# Patient Record
Sex: Female | Born: 1990 | Race: White | Hispanic: No | Marital: Single | Smoking: Current every day smoker
Health system: Southern US, Community
[De-identification: ages and names within clinical notes are randomized; demographics above are authoritative.]

## PROBLEM LIST (undated history)

## (undated) DIAGNOSIS — F1120 Opioid dependence, uncomplicated: Secondary | ICD-10-CM

## (undated) DIAGNOSIS — F1920 Other psychoactive substance dependence, uncomplicated: Secondary | ICD-10-CM

---

## 2013-12-26 ENCOUNTER — Emergency Department (HOSPITAL_COMMUNITY)
Admission: EM | Admit: 2013-12-26 | Discharge: 2013-12-27 | Disposition: A | Discharge status: Other Acute Inpatient Hospital | Payer: BC Managed Care – PPO | Attending: Emergency Medicine | Admitting: Emergency Medicine

## 2013-12-26 ENCOUNTER — Encounter (HOSPITAL_COMMUNITY): Payer: Self-pay | Admitting: Emergency Medicine

## 2013-12-26 DIAGNOSIS — S199XXA Unspecified injury of neck, initial encounter: Secondary | ICD-10-CM

## 2013-12-26 DIAGNOSIS — Y92410 Unspecified street and highway as the place of occurrence of the external cause: Secondary | ICD-10-CM | POA: Insufficient documentation

## 2013-12-26 DIAGNOSIS — F1010 Alcohol abuse, uncomplicated: Secondary | ICD-10-CM | POA: Insufficient documentation

## 2013-12-26 DIAGNOSIS — F329 Major depressive disorder, single episode, unspecified: Secondary | ICD-10-CM

## 2013-12-26 DIAGNOSIS — S80211A Abrasion, right knee, initial encounter: Secondary | ICD-10-CM

## 2013-12-26 DIAGNOSIS — IMO0002 Reserved for concepts with insufficient information to code with codable children: Secondary | ICD-10-CM | POA: Insufficient documentation

## 2013-12-26 DIAGNOSIS — M542 Cervicalgia: Secondary | ICD-10-CM

## 2013-12-26 DIAGNOSIS — F10929 Alcohol use, unspecified with intoxication, unspecified: Secondary | ICD-10-CM

## 2013-12-26 DIAGNOSIS — Y9389 Activity, other specified: Secondary | ICD-10-CM | POA: Insufficient documentation

## 2013-12-26 DIAGNOSIS — R45851 Suicidal ideations: Secondary | ICD-10-CM | POA: Insufficient documentation

## 2013-12-26 DIAGNOSIS — S0993XA Unspecified injury of face, initial encounter: Secondary | ICD-10-CM | POA: Insufficient documentation

## 2013-12-26 DIAGNOSIS — F32A Depression, unspecified: Secondary | ICD-10-CM

## 2013-12-26 DIAGNOSIS — R4182 Altered mental status, unspecified: Secondary | ICD-10-CM | POA: Insufficient documentation

## 2013-12-26 DIAGNOSIS — Z23 Encounter for immunization: Secondary | ICD-10-CM | POA: Insufficient documentation

## 2013-12-26 DIAGNOSIS — F3289 Other specified depressive episodes: Secondary | ICD-10-CM | POA: Insufficient documentation

## 2013-12-26 DIAGNOSIS — S80212A Abrasion, left knee, initial encounter: Secondary | ICD-10-CM

## 2013-12-26 DIAGNOSIS — Z3202 Encounter for pregnancy test, result negative: Secondary | ICD-10-CM | POA: Insufficient documentation

## 2013-12-26 DIAGNOSIS — F17200 Nicotine dependence, unspecified, uncomplicated: Secondary | ICD-10-CM | POA: Insufficient documentation

## 2013-12-26 HISTORY — DX: Opioid dependence, uncomplicated: F11.20

## 2013-12-26 HISTORY — DX: Other psychoactive substance dependence, uncomplicated: F19.20

## 2013-12-26 MED ORDER — TETANUS-DIPHTH-ACELL PERTUSSIS 5-2.5-18.5 LF-MCG/0.5 IM SUSP
0.5000 mL | Freq: Once | INTRAMUSCULAR | Status: AC
  Administered 2013-12-27: 0.5 mL via INTRAMUSCULAR
  Filled 2013-12-26: qty 0.5
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
## 2013-12-27 ENCOUNTER — Encounter (HOSPITAL_COMMUNITY): Payer: Self-pay

## 2013-12-27 ENCOUNTER — Emergency Department (HOSPITAL_COMMUNITY): Payer: BC Managed Care – PPO

## 2013-12-27 ENCOUNTER — Inpatient Hospital Stay (HOSPITAL_COMMUNITY)
Admission: AD | Admit: 2013-12-27 | Discharge: 2013-12-31 | DRG: 897 | Disposition: A | Discharge status: Home / Self Care | Payer: Medicaid Other | Source: Intra-hospital | Attending: Psychiatry | Admitting: Psychiatry

## 2013-12-27 DIAGNOSIS — F329 Major depressive disorder, single episode, unspecified: Secondary | ICD-10-CM

## 2013-12-27 DIAGNOSIS — F1994 Other psychoactive substance use, unspecified with psychoactive substance-induced mood disorder: Secondary | ICD-10-CM | POA: Diagnosis present

## 2013-12-27 DIAGNOSIS — F1010 Alcohol abuse, uncomplicated: Principal | ICD-10-CM | POA: Diagnosis present

## 2013-12-27 DIAGNOSIS — F10129 Alcohol abuse with intoxication, unspecified: Secondary | ICD-10-CM | POA: Diagnosis present

## 2013-12-27 DIAGNOSIS — F1120 Opioid dependence, uncomplicated: Secondary | ICD-10-CM | POA: Diagnosis present

## 2013-12-27 DIAGNOSIS — F17200 Nicotine dependence, unspecified, uncomplicated: Secondary | ICD-10-CM | POA: Diagnosis present

## 2013-12-27 DIAGNOSIS — F32A Depression, unspecified: Secondary | ICD-10-CM | POA: Diagnosis present

## 2013-12-27 LAB — CBC WITH DIFFERENTIAL/PLATELET
BASOS PCT: 2 % — AB (ref 0–1)
Basophils Absolute: 0.1 10*3/uL (ref 0.0–0.1)
EOS PCT: 4 % (ref 0–5)
Eosinophils Absolute: 0.3 10*3/uL (ref 0.0–0.7)
HCT: 41.2 % (ref 36.0–46.0)
HEMOGLOBIN: 14.4 g/dL (ref 12.0–15.0)
LYMPHS PCT: 31 % (ref 12–46)
Lymphs Abs: 2 10*3/uL (ref 0.7–4.0)
MCH: 30.4 pg (ref 26.0–34.0)
MCHC: 35 g/dL (ref 30.0–36.0)
MCV: 86.9 fL (ref 78.0–100.0)
MONOS PCT: 8 % (ref 3–12)
Monocytes Absolute: 0.5 10*3/uL (ref 0.1–1.0)
NEUTROS PCT: 55 % (ref 43–77)
Neutro Abs: 3.6 10*3/uL (ref 1.7–7.7)
Platelets: 248 10*3/uL (ref 150–400)
RBC: 4.74 MIL/uL (ref 3.87–5.11)
RDW: 13.1 % (ref 11.5–15.5)
WBC: 6.5 10*3/uL (ref 4.0–10.5)

## 2013-12-27 LAB — RAPID URINE DRUG SCREEN, HOSP PERFORMED
Amphetamines: NOT DETECTED
Barbiturates: NOT DETECTED
Benzodiazepines: NOT DETECTED
COCAINE: NOT DETECTED
Opiates: NOT DETECTED
Tetrahydrocannabinol: NOT DETECTED

## 2013-12-27 LAB — ETHANOL: ALCOHOL ETHYL (B): 207 mg/dL — AB (ref 0–11)

## 2013-12-27 LAB — BASIC METABOLIC PANEL
BUN: 7 mg/dL (ref 6–23)
CO2: 22 mEq/L (ref 19–32)
Calcium: 9.3 mg/dL (ref 8.4–10.5)
Chloride: 105 mEq/L (ref 96–112)
Creatinine, Ser: 0.67 mg/dL (ref 0.50–1.10)
Glucose, Bld: 94 mg/dL (ref 70–99)
POTASSIUM: 3.7 meq/L (ref 3.7–5.3)
Sodium: 144 mEq/L (ref 137–147)

## 2013-12-27 LAB — POCT PREGNANCY, URINE: Preg Test, Ur: NEGATIVE

## 2013-12-27 MED ORDER — ONDANSETRON HCL 4 MG PO TABS: 4.0000 mg | ORAL_TABLET | Freq: Three times a day (TID) | ORAL | Status: DC | PRN

## 2013-12-27 MED ORDER — ZOLPIDEM TARTRATE 5 MG PO TABS: 5.0000 mg | ORAL_TABLET | Freq: Every evening | ORAL | Status: DC | PRN

## 2013-12-27 MED ORDER — ACETAMINOPHEN 325 MG PO TABS
650.0000 mg | ORAL_TABLET | ORAL | Status: DC | PRN
  Administered 2013-12-27: 650 mg via ORAL
  Filled 2013-12-27: qty 2

## 2013-12-27 MED ORDER — METHADONE HCL 5 MG PO TABS
24.0000 mg | ORAL_TABLET | Freq: Once | ORAL | Status: AC
  Administered 2013-12-27: 25 mg via ORAL
  Filled 2013-12-27: qty 5

## 2013-12-27 MED ORDER — LORAZEPAM 1 MG PO TABS: 1.0000 mg | ORAL_TABLET | Freq: Three times a day (TID) | ORAL | Status: DC | PRN

## 2013-12-27 MED ORDER — IBUPROFEN 200 MG PO TABS
600.0000 mg | ORAL_TABLET | Freq: Three times a day (TID) | ORAL | Status: DC | PRN
  Administered 2013-12-27: 600 mg via ORAL
  Filled 2013-12-27 (×2): qty 1

## 2013-12-27 MED ORDER — ALUM & MAG HYDROXIDE-SIMETH 200-200-20 MG/5ML PO SUSP: 30.0000 mL | ORAL | Status: DC | PRN

## 2013-12-27 MED ORDER — NICOTINE 21 MG/24HR TD PT24
21.0000 mg | MEDICATED_PATCH | Freq: Every day | TRANSDERMAL | Status: DC
  Filled 2013-12-27: qty 1

## 2013-12-27 NOTE — ED Notes (Signed)
[facility] [employer] requested at this time- no time frame given. Patient updated on plan of care.

## 2013-12-27 NOTE — ED Notes (Signed)
boyfriend at bedside

## 2013-12-27 NOTE — ED Notes (Signed)
PT a desk talking to family on telephone.

## 2013-12-27 NOTE — BHH Counselor (Signed)
Pt signed consent to release info earlier today. Pt's mom indicates she will call to check on pt's disposition later today or either she will visit pt.  Evette Cristalaroline Paige Taylar Hartsough, [place] Assessment Counselor

## 2013-12-27 NOTE — ED Notes (Signed)
Pt on the phone, stating "I'd rather not be here, I'm tired of being here.". Pt swearing multiple times. MD notified.

## 2013-12-27 NOTE — ED Notes (Signed)
Pt father called to speak to daughter.

## 2013-12-27 NOTE — ED Notes (Signed)
Pt had a bag of money that was in police custody when in prison. Pt just released from prison earlier yesterday. Police officer handed pt's mother money with pt's approval. Mom locked up money in her car for safe keeping. Police officer witnessed exchange of cash and mom taking money to her car.

## 2013-12-27 NOTE — ED Notes (Signed)
Dr. Rubin PayorPickering states pt cannot leave, psych tech notified to start IVC paperwork.

## 2013-12-27 NOTE — Tx Team (Signed)
Initial Interdisciplinary Treatment Plan  PATIENT STRENGTHS: (choose at least two) Ability for insight Average or above average intelligence  PATIENT STRESSORS: Legal issue Occupational concerns   PROBLEM LIST: Problem List/Patient Goals Date to be addressed Date deferred Reason deferred Estimated date of resolution  Anger 12/27/13                                                      DISCHARGE CRITERIA:  Improved stabilization in mood, thinking, and/or behavior Need for constant or close observation no longer present Reduction of life-threatening or endangering symptoms to within safe limits Verbal commitment to aftercare and medication compliance  PRELIMINARY DISCHARGE PLAN: Attend 12-step recovery group Return to previous living arrangement  PATIENT/FAMIILY INVOLVEMENT: This treatment plan has been presented to and reviewed with the patient, Janice Torres, and/or family member,   The patient and family have been given the opportunity to ask questions and make suggestions.  Johny DrillingLeecost, Numa Heatwole Dawkins 12/27/2013, 10:32 PM

## 2013-12-27 NOTE — ED Notes (Signed)
Per report from previous nurse, was told this patient was not at risk for suicide.
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
## 2013-12-27 NOTE — BHH Counselor (Signed)
Writer called Janice GulaKaren RN to let her know writer will do TA at 8:30 [facility].   Janice Torres, [place] Assessment Counselor

## 2013-12-27 NOTE — ED Notes (Signed)
Tele psych placed at bedside. 

## 2013-12-27 NOTE — ED Notes (Signed)
Pt became extremely agitated, bellicose, and refused to move to Pod C. [employer] and Security called to the bedside to assist pt to Pod C. Dr. Preston FleetingGlick to initiate IVC papers on patient due to elopement risk.

## 2013-12-27 NOTE — ED Notes (Signed)
Pt states that she agrees to stay tonight for continued monitoring and telepsych, agrees to cooperate with staff as well.  Currently calm and cooperative.  Pt admits to drinking and marijuana tonight but denies any other drug use.  Denies trying to harm herself her others.  Pt tearful at present, offered food and water.  Pt states she just wants to sleep- will continue to monitor pt.

## 2013-12-27 NOTE — ED Notes (Signed)
Food tray ordered

## 2013-12-27 NOTE — ED Notes (Signed)
With parents encouragement, pt [facility].

## 2013-12-27 NOTE — ED Notes (Signed)
TTS completed. 

## 2013-12-27 NOTE — ED Notes (Signed)
PT returned to room and requested soda and sandwich.

## 2013-12-27 NOTE — ED Notes (Signed)
Pt at nurse's station using phone
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
## 2013-12-27 NOTE — ED Notes (Signed)
[facility] belongings and [facility]. No questions/concerns voiced by patient at this time

## 2013-12-27 NOTE — ED Notes (Signed)
Psych tech at bedside with family members.

## 2013-12-27 NOTE — ED Notes (Signed)
Patient transported to CT 

## 2013-12-27 NOTE — Progress Notes (Signed)
B.Emmelina Mcloughlin, MHT received report from Thurman CoyerEric Kaplan, [facility] has a bed available but can not transfer until 9 pm tonight. Patient has been assigned to Bed 501-1 accepted by Claudette Headonrad Withrow, NP for Dr. Nino GlowJonalaggda. Writer informed attending nurse of plan. Writer in patients room to discuss plan, notation of conversation to follow.

## 2013-12-27 NOTE — ED Notes (Signed)
Pt put into blue paper scrubs. All personal belongings put in personal belonging bag. Pt wanded by security.

## 2013-12-27 NOTE — ED Notes (Signed)
EMTALA form completed- checked and given ok by Leretha DykesMelissa Browning, AD.
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
## 2013-12-28 DIAGNOSIS — F10129 Alcohol abuse with intoxication, unspecified: Secondary | ICD-10-CM | POA: Diagnosis present

## 2013-12-28 DIAGNOSIS — F411 Generalized anxiety disorder: Secondary | ICD-10-CM

## 2013-12-28 DIAGNOSIS — F329 Major depressive disorder, single episode, unspecified: Secondary | ICD-10-CM | POA: Diagnosis present

## 2013-12-28 DIAGNOSIS — F332 Major depressive disorder, recurrent severe without psychotic features: Secondary | ICD-10-CM

## 2013-12-28 DIAGNOSIS — F1994 Other psychoactive substance use, unspecified with psychoactive substance-induced mood disorder: Secondary | ICD-10-CM | POA: Diagnosis present

## 2013-12-28 DIAGNOSIS — F32A Depression, unspecified: Secondary | ICD-10-CM | POA: Diagnosis present

## 2013-12-28 DIAGNOSIS — F1120 Opioid dependence, uncomplicated: Secondary | ICD-10-CM | POA: Diagnosis present

## 2013-12-28 MED ORDER — BACITRACIN-NEOMYCIN-POLYMYXIN 400-5-5000 EX OINT
TOPICAL_OINTMENT | CUTANEOUS | Status: DC | PRN
  Administered 2013-12-28 – 2013-12-30 (×4): 1 via TOPICAL
  Filled 2013-12-28 (×4): qty 1

## 2013-12-28 MED ORDER — ACETAMINOPHEN 325 MG PO TABS
650.0000 mg | ORAL_TABLET | Freq: Four times a day (QID) | ORAL | Status: DC | PRN
Start: 2013-12-28 — End: 2013-12-31
  Administered 2013-12-28 – 2013-12-30 (×4): 650 mg via ORAL
  Filled 2013-12-28 (×4): qty 2

## 2013-12-28 MED ORDER — IBUPROFEN 600 MG PO TABS
600.0000 mg | ORAL_TABLET | Freq: Four times a day (QID) | ORAL | Status: DC | PRN
  Administered 2013-12-28 – 2013-12-31 (×8): 600 mg via ORAL
  Filled 2013-12-28 (×7): qty 1
  Filled 2013-12-28: qty 3

## 2013-12-28 MED ORDER — TRAZODONE HCL 50 MG PO TABS: 50.0000 mg | ORAL_TABLET | Freq: Every evening | ORAL | Status: DC | PRN

## 2013-12-28 MED ORDER — MAGNESIUM HYDROXIDE 400 MG/5ML PO SUSP: 30.0000 mL | Freq: Every day | ORAL | Status: DC | PRN

## 2013-12-28 MED ORDER — ALUM & MAG HYDROXIDE-SIMETH 200-200-20 MG/5ML PO SUSP: 30.0000 mL | ORAL | Status: DC | PRN

## 2013-12-28 NOTE — [facility] Group Notes (Signed)
[facility] L[facility] Planning [facility] Note   12/28/2013 9:32 AM  Participation Quality:  Did not attend    Raye Sorrowoble, Lizandra Zakrzewski N

## 2013-12-28 NOTE — [facility] Suicide Risk Assessment (Signed)
[facility] INPATIENT:  Family/Significant Other Suicide Prevention Education  Suicide Prevention Education:  Education Completed; Mother Everlean PattersonJackie Frizzell [phone],  (name of family member/significant other) has been identified by the patient as the family member/significant other with whom the patient will be residing, and identified as the person(s) who will aid the patient in the event of a mental health crisis (suicidal ideations/suicide attempt).  With written consent from the patient, the family member/significant other has been provided the following suicide prevention education, prior to the and/or following the discharge of the patient.  The suicide prevention education provided includes the following:  Suicide risk factors  Suicide prevention and interventions  National Suicide Hotline telephone number  [facility] assessment telephone number  [facility] Emergency Assistance 911  [facility] and/or Residential Mobile Crisis Unit telephone number  Request made of family/significant other to:  Remove weapons (e.g., guns, rifles, knives), all items previously/currently identified as safety concern.    Remove drugs/medications (over-the-counter, prescriptions, illicit drugs), all items previously/currently identified as a safety concern.  The family member/significant other verbalizes understanding of the suicide prevention education information provided.  The family member/significant other agrees to remove the items of safety concern listed above.  Simona HuhYang, Billyjack Trompeter 12/28/2013, 11:10 AM

## 2013-12-28 NOTE — [facility] Suicide Risk Assessment (Signed)
   Nursing information obtained from:  Patient Demographic factors:  Adolescent or young adult;Caucasian Current Mental Status:  Suicidal ideation indicated by others Loss Factors:  Legal issues Historical Factors:  NA Risk Reduction Factors:  Living with another person, especially a relative;Positive social support Total Time spent with patient: 45 minutes  CLINICAL FACTORS:   Severe Anxiety and/or Agitation Depression:   Anhedonia Comorbid alcohol abuse/dependence Hopelessness Impulsivity Insomnia Recent sense of peace/wellbeing Severe Alcohol/Substance Abuse/Dependencies Unstable or Poor Therapeutic Relationship Previous Psychiatric Diagnoses and Treatments  Psychiatric Specialty Exam: Physical Exam  Constitutional: She is oriented to person, place, and time. She appears well-developed.  HENT:  Head: Normocephalic.  Neck: Normal range of motion.  Cardiovascular: Normal rate.   Respiratory: Effort normal.  GI: Soft.  Musculoskeletal: She exhibits tenderness.  Neurological: She is alert and oriented to person, place, and time.  Skin: Skin is warm.    Review of Systems  Musculoskeletal: Positive for myalgias.  Neurological: Negative.   Psychiatric/Behavioral: Positive for depression, suicidal ideas and substance abuse. The patient is nervous/anxious.   All other systems reviewed and are negative.    Blood pressure 119/76, pulse 84, temperature 97.9 F (36.6 C), temperature source Oral, resp. rate 16, height 5\' 11"  (1.803 m), weight 62.596 kg (138 lb).Body mass index is 19.26 kg/(m^2).  General Appearance: Disheveled and Guarded  Eye SolicitorContact::  Fair  Speech:  Clear and Coherent and Slow  Volume:  Decreased  Mood:  Anxious, Depressed, Hopeless, Irritable and Worthless  Affect:  Depressed and Flat  Thought Process:  Goal Directed and Intact  Orientation:  Full (Time, Place, and Person)  Thought Content:  Rumination  Suicidal Thoughts:  Yes.  with intent/plan   Homicidal Thoughts:  No  Memory:  Immediate;   Fair  Judgement:  Impaired  Insight:  Lacking  Psychomotor Activity:  Restlessness  Concentration:  Fair  Recall:  FiservFair  Fund of Knowledge:Fair  Language: Fair  Akathisia:  NA  Handed:  Right  AIMS (if indicated):     Assets:  Communication Skills Desire for Improvement Leisure Time Physical Health Resilience Social Support Transportation  Sleep:  Number of Hours: 6   Musculoskeletal: Strength & Muscle Tone: within normal limits Gait & Station: normal Patient leans: N/A  COGNITIVE FEATURES THAT CONTRIBUTE TO RISK:  Closed-mindedness Loss of executive function Polarized thinking    SUICIDE RISK:   Moderate:  Frequent suicidal ideation with limited intensity, and duration, some specificity in terms of plans, no associated intent, good self-control, limited dysphoria/symptomatology, some risk factors present, and identifiable protective factors, including available and accessible social support.  PLAN OF CARE: Admitted for crisis stabilization, safety monitoring and medication management of depression, opiate dependence and alcohol abuse with intoxication. Patient reportedly was on meth[facility] maintenance for [facility]iate [facility].  I certify that inpatient services furnished can reasonably be expected to improve the patient's condition.  Lando Alcalde,JANARDHAHA R. 12/28/2013, 11:41 AM

## 2013-12-28 NOTE — [facility] Group Notes (Signed)
[facility] LCSW Group Therapy  12/28/2013 2:50 PM   Type of Therapy:  Group Therapy  Participation Level:  Active  Participation Quality:  Attentive  Affect:  Appropriate  Cognitive:  Appropriate  Insight:  Improving  Engagement in Therapy:  Engaged  Modes of Intervention:  Clarification, Education, Exploration and Socialization  Summary of Progress/Problems: Today's group focused on relapse prevention.  We defined the term, and then brainstormed on ways to prevent relapse.  Janice Torres came in late.  She was quiet, but was clearly paying attention.  I gave her a response to contribute at the end, but she declined, saying she felt she had learned a lot from other patients.  Janice Torres, Janice Torres 12/28/2013 , 2:50 PM

## 2013-12-28 NOTE — Progress Notes (Signed)
D: Pt denies SI/HI/AVH. Pt is pleasant and cooperative. Pt stated  she is done with drinking.    A: Pt was offered support and encouragement. Pt was given scheduled medications. Pt was encourage to attend groups. Q 15 minute checks were done for safety.   R:Pt attends groups and interacts well with peers and staff. Pt is taking medication.Pt receptive to treatment and safety maintained on unit.

## 2013-12-28 NOTE — Progress Notes (Signed)
Patient ID: Janice Torres, female   DOB: [birth year], 23 y.o.   MRN: [id number]  Pt asleep; no s/s of distress noted at this time. Respirations regular and unlabored.
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
## 2013-12-29 DIAGNOSIS — F1910 Other psychoactive substance abuse, uncomplicated: Secondary | ICD-10-CM

## 2013-12-29 DIAGNOSIS — F320 Major depressive disorder, single episode, mild: Secondary | ICD-10-CM

## 2013-12-29 MED ORDER — METHADONE HCL 5 MG PO TABS: 24.0000 mg | ORAL_TABLET | Freq: Every day | ORAL | Status: DC

## 2013-12-29 MED ORDER — METHADONE HCL 5 MG PO TABS
25.0000 mg | ORAL_TABLET | Freq: Every day | ORAL | Status: DC
  Administered 2013-12-29 – 2013-12-31 (×3): 25 mg via ORAL
  Filled 2013-12-29: qty 2
  Filled 2013-12-29 (×2): qty 1
  Filled 2013-12-29: qty 2
  Filled 2013-12-29: qty 1
  Filled 2013-12-29: qty 2
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
## 2013-12-29 NOTE — Progress Notes (Signed)
[facility] Group Notes:  (Nursing/MHT/Case Management/Adjunct)  Date:  12/29/2013  Time:  11:27 PM  Type of Therapy:  Group Therapy  Participation Level:  Active  Participation Quality:  Appropriate  Affect:  Appropriate  Cognitive:  Appropriate  Insight:  Appropriate  Engagement in Group:  Engaged  Modes of Intervention:  Socialization and Support  Summary of Progress/Problems: Pt. Stated "had a wonderful day." Pt. Stated she enjoyed talking and helping others, and would use her support system to cope with stress.  Sondra ComeWilson, Katiya Fike J 12/29/2013, 11:27 PM

## 2013-12-29 NOTE — Progress Notes (Signed)
D Janice Torres cont to get better acquainted with the unit and the policies as the day goes on. She is surprisingly cooperative...taking her methadone and 1 advil, this morning without diff...attending her groups and doing her work in her Saturday workbook.    A Her affect is much brighter. She makes good eye contact with this nurse and  Interacts appropriately with her peers. She completed her morning self inventory and on it she writes she denies SI within the past 24 hrs, she rates her depression and hopelessness " 1/1", respectively and says her  DC plan is to quit drinking.    R Safety is in place and poc moves forward.

## 2013-12-29 NOTE — [facility] Group Notes (Signed)
[facility] Group Notes: (Clinical Social Work)   12/29/2013      Type of Therapy:  Group Therapy   Participation Level:  Did Not Attend - did come in for last few minutes of group, sat quietly   Pilgrim's PrideMareida Grossman-Orr, LCSW 12/29/2013, 3:29 PM

## 2013-12-29 NOTE — Progress Notes (Signed)
Adult Psychoeducational Group Note  Date:  12/29/2013 Time:  1:02 AM  Group Topic/Focus:  Wrap-Up Group:   The focus of this group is to help patients review their daily goal of treatment and discuss progress on daily workbooks.  Participation Level:  Active  Participation Quality:  Appropriate  Affect:  Appropriate  Cognitive:  Appropriate  Insight: Appropriate  Engagement in Group:  Engaged  Modes of Intervention:  Discussion  Additional Comments:  Pt attended wrap-up group this evening and participated in group with peers.   Janice Torres A 12/29/2013, 1:02 AM
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
## 2013-12-29 NOTE — Progress Notes (Signed)
Patient ID: Janice Torres, female   DOB: November 15, 1991, 23 y.o.   MRN: [id number]  [facility]acy [facility] at [phone] who confirms that patient has been receiving Methadone treatment through their facility. States patient was on Methadone 25 mg PO once daily from 12/18/13 to 12/23/13. Patient's dose was changed to 24 mg PO once daily on 12/24/13 and this is her current dose.   Plan:  Will order Methadone 24 mg PO once daily.  Order placed for Methadone 24 mg however the nearest dose that can be ordered is 25 mg.     Alberteen SamFran Hobson, FNP-BC  Reviewed the information documented and agree with the treatment plan.  Lilburn Straw,JANARDHAHA R. 12/29/2013 7:16 PM

## 2013-12-30 NOTE — Progress Notes (Signed)
Late entry for 12/29/2013. Written on 12/30/2013  Psychoeducational Group Note    Date: 12/30/2013 Time:  0930  Goal Setting Purpose of Group: To be able to set a goal that is measurable and that can be accomplished in one day Participation Level:  Active  Participation Quality:  Appropriate  Affect:  Appropriate  Cognitive:  Oriented  Insight:  Improving  Engagement in Group:  Engaged  Additional Comments:  Participating and engaged.  Anuja Manka A 
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
## 2013-12-30 NOTE — [facility] Group Notes (Signed)
[facility] Group Notes: (Clinical Social Work)   12/30/2013      Type of Therapy:  Group Therapy   Participation Level:  Did Not Attend - came in momentarily and left again   Ambrose MantleMareida Grossman-Orr, LCSW 12/30/2013, 5:03 PM

## 2013-12-30 NOTE — Progress Notes (Signed)
Patient ID: Janice Torres, female   DOB: 12-24-1990, 23 y.o.   MRN: [id number] [facility] MD Progress Note  12/30/2013 2:12 PM Janice Torres  MRN:  [id number] Subjective:  Patient has signed her 72 hour discharge, up on Monday early (approximately 1040AM). Pt states she is ready for discharge tomorrow and minimizing depression/anxiety symptoms. Pt is very pleasant, calm, cooperative, and open without guarding. Pt reports that she will take her last dose of Methadone here tomorrow morning, then [place] she [place] receiving [place]one therapy. Pt denies SI, HI, and AVH, contracts for safety. Pt is in agreement with medication and treatment plan.   Diagnosis:   DSM5:  Substance/Addictive Disorders:  Alcohol Related Disorder - Mild (305.00), Alcohol Intoxication with Use Disorder - Mild ((F10.129) and Opioid Disorder - Severe (304.00) Depressive Disorders:  Major Depressive Disorder - Mild (296.21) Total Time spent with patient: 30 minutes  Axis I: Alcohol Abuse, Depressive Disorder NOS, Substance Abuse and Substance Induced Mood Disorder Axis II: Deferred Axis III:  Past Medical History  Diagnosis Date  . Polysubstance dependence including opioid type drug, episodic abuse etoh and heroin   Axis IV: other psychosocial or environmental problems, problems related to social environment and problems with primary support group Axis V: 41-50 serious symptoms  ADL's:  Intact  Sleep: Good  Appetite:  Good  Suicidal Ideation:  Denies Homicidal Ideation:  Denies  Psychiatric Specialty Exam: Physical Exam  Constitutional: She is oriented to person, place, and time. She appears well-developed and well-nourished.  HENT:  Head: Normocephalic and atraumatic.  Neck: Normal range of motion.  Respiratory: Effort normal.  Musculoskeletal: Normal range of motion.  Neurological: She is alert and oriented to person, place, and time.  Skin: Skin is warm.    Review of Systems   Constitutional: Negative.   HENT: Negative.   Eyes: Negative.   Respiratory: Negative.   Cardiovascular: Negative.   Gastrointestinal: Negative.   Genitourinary: Negative.   Musculoskeletal: Negative.   Skin: Negative.   Neurological: Negative.   Endo/Heme/Allergies: Negative.   Psychiatric/Behavioral: Positive for depression and substance abuse. The patient is nervous/anxious.     Blood pressure 113/78, pulse 56, temperature 98.1 F (36.7 C), temperature source Oral, resp. rate 16, height 5\' 11"  (1.803 m), weight 62.596 kg (138 lb).Body mass index is 19.26 kg/(m^2).  General Appearance: Casual  Eye Contact::  Fair  Speech:  Normal Rate  Volume:  Normal  Mood:  Anxious  Affect:  Congruent  Thought Process:  Coherent  Orientation:  Full (Time, Place, and Person)  Thought Content:  WDL  Suicidal Thoughts:  No  Homicidal Thoughts:  No  Memory:  Immediate;   Fair Recent;   Fair Remote;   Fair  Judgement:  Poor  Insight:  Fair  Psychomotor Activity:  Normal  Concentration:  Fair  Recall:  [employer]Fair  Fund of Knowledge:Fair  Language: Fair  Akathisia:  No  Handed:  Right  AIMS (if indicated):     Assets:  Physical Health Resilience Social Support  Sleep:  Number of Hours: 6   Musculoskeletal: Strength & Muscle Tone: within normal limits Gait & Station: normal Patient leans: N/A  Current Medications: Current Facility-Administered Medications  Medication Dose Route Frequency Provider Last Rate Last Dose  . acetaminophen (TYLENOL) tablet 650 mg  650 mg Oral Q6H PRN Kristeen MansFran E Hobson, NP   650 mg at 12/30/13 0800  . alum & mag hydroxide-simeth (MAALOX/MYLANTA) 200-200-20 MG/5ML suspension 30 mL  30  mL Oral Q4H PRN Kristeen Mans, NP      . ibuprofen (ADVIL,MOTRIN) tablet 600 mg  600 mg Oral Q6H PRN Court Joy, PA-C   600 mg at 12/30/13 7829  . magnesium hydroxide (MILK OF MAGNESIA) suspension 30 mL  30 mL Oral Daily PRN Kristeen Mans, NP      . methadone (DOLOPHINE) tablet  25 mg  25 mg Oral Daily Nehemiah Settle, MD   25 mg at 12/30/13 5621  . neomycin-bacitracin-polymyxin (NEOSPORIN) ointment   Topical PRN Court Joy, PA-C   1 application at 12/30/13 1201  . traZODone (DESYREL) tablet 50 mg  50 mg Oral QHS PRN Kristeen Mans, NP        Lab Results: No results found for this or any previous visit (from the past 48 hour(s)).  Physical Findings: AIMS:  , ,  ,  ,    CIWA:  CIWA-Ar Total: 0 COWS:     Treatment Plan Summary: Daily contact with patient to assess and evaluate symptoms and progress in treatment Medication management  Plan:  Review of chart, vital signs, medications, and notes. 1-Individual and group therapy 2-Medication management for depression and anxiety:  Medications reviewed with the patient and she stated no untoward effects, no changes made 3-Coping skills for depression, anxiety, and substance abuse  4-Continue crisis stabilization and management 5-Address health issues--monitoring vital signs, stable 6-Treatment plan in progress to prevent relapse of depression and anxiety  Medical Decision Making Problem Points:  Established problem, stable/improving (1) and Review of psycho-social stressors (1) Data Points:  Review of new medications or change in dosage (2)  I certify that inpatient services furnished can reasonably be expected to improve the patient's condition.   Beau Fanny, FNP-BC 12/30/2013, 2:12 PM  Patient seen, evaluated and I agree with notes by Nurse Practitioner. Thedore Mins, MD

## 2013-12-30 NOTE — Progress Notes (Signed)
Psychoeducational Group Note  Date: 12/30/2013 Time:  0930 Group Topic/Focus:  Gratefulness:  The focus of this group is to help patients identify what two things they are most grateful for in their lives. What helps ground them and to center them on their work to their recovery.  Participation Level:  Active  Participation Quality:  Appropriate  Affect:  Appropriate  Cognitive:  Oriented  Insight:  Improving  Engagement in Group:  Engaged  Additional Comments:  participated in the group.  Merve Hotard A   

## 2013-12-30 NOTE — Progress Notes (Signed)
Late entry for 12-29-2013 written on 12-30-2013   Psychoeducational Group Note  Date: 12/30/2013 Time:  1015  Group Topic/Focus:  Identifying Needs:   The focus of this group is to help patients identify their personal needs that have been historically problematic and identify healthy behaviors to address their needs.  Participation Level:  Active  Participation Quality:  Appropriate  Affect:  Appropriate  Cognitive:  Oriented  Insight:  Improving  Engagement in Group:  Engaged  Additional Comments:   Bartosz Luginbill A 

## 2013-12-30 NOTE — Progress Notes (Signed)
Psychoeducational Group Note  Date:  12/30/2013 Time:  1015  Group Topic/Focus:  Making Healthy Choices:   The focus of this group is to help patients identify negative/unhealthy choices they were using prior to admission and identify positive/healthier coping strategies to replace them upon discharge.  Participation Level:  Active  Participation Quality:  Appropriate  Affect:  Appropriate  Cognitive:  Oriented  Insight:  Improving  Engagement in Group:  Engaged  Additional Comments:    Annasofia Pohl A 12/30/2013 

## 2013-12-30 NOTE — Progress Notes (Signed)
Pt continues to do well. Complains of soreness though reports pain has overall lessened to a 6/10 from an 8/10. Receiving ibuprofen as needed. Affect anxious with congruent mood though improving. Feels she will be ready for discharge when her 72 hours is up at 1040 on Monday. Supported and encouraged. Denies SI/HI/AVH and remains safe. Lawrence MarseillesFriedman, Roshawnda Pecora Eakes

## 2013-12-30 NOTE — Progress Notes (Signed)
Adult Psychoeducational Group Note  Date:  12/30/2013 Time:  8:00 pm  Group Topic/Focus:  Wrap-Up Group:   The focus of this group is to help patients review their daily goal of treatment and discuss progress on daily workbooks.  Participation Level:  Active  Participation Quality:  Appropriate and Sharing  Affect:  Appropriate  Cognitive:  Appropriate  Insight: Appropriate  Engagement in Group:  Engaged  Modes of Intervention:  Discussion, Education, Socialization and Support  Additional Comments:  Pt stated that she has learned to identify the triggers to her anger. Pt stated she has learned to stop it before it goes too far. Pt identified that she is friendly, caring and sweet.   Diala Waxman 12/30/2013, 10:07 PM

## 2013-12-31 DIAGNOSIS — F1010 Alcohol abuse, uncomplicated: Principal | ICD-10-CM

## 2013-12-31 DIAGNOSIS — F1994 Other psychoactive substance use, unspecified with psychoactive substance-induced mood disorder: Secondary | ICD-10-CM

## 2013-12-31 DIAGNOSIS — F1120 Opioid dependence, uncomplicated: Secondary | ICD-10-CM
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
## 2013-12-31 NOTE — [facility] Suicide Risk Assessment (Signed)
   Demographic Factors:  Adolescent or young adult, Caucasian, Low socioeconomic status and Unemployed  Total Time spent with patient: 30 minutes  Psychiatric Specialty Exam: Physical Exam  Review of Systems  All other systems reviewed and are negative.    Blood pressure 121/78, pulse 81, temperature 97.9 F (36.6 C), temperature source Oral, resp. rate 18, height 5\' 11"  (1.803 m), weight 62.596 kg (138 lb).Body mass index is 19.26 kg/(m^2).  General Appearance: Casual  Eye Contact::  Good  Speech:  Clear and Coherent  Volume:  Normal  Mood:  Euthymic  Affect:  Appropriate and Congruent  Thought Process:  Goal Directed and Intact  Orientation:  Full (Time, Place, and Person)  Thought Content:  WDL  Suicidal Thoughts:  No  Homicidal Thoughts:  No  Memory:  Immediate;   Fair  Judgement:  Fair  Insight:  Fair  Psychomotor Activity:  Normal  Concentration:  Good  Recall:  Fair  Fund of Knowledge:Good  Language: Good  Akathisia:  NA  Handed:  Right  AIMS (if indicated):     Assets:  Communication Skills Desire for Improvement Financial Resources/Insurance Housing Intimacy Leisure Time Physical Health Resilience Social Support Transportation  Sleep:  Number of Hours: 5.75    Musculoskeletal: Strength & Muscle Tone: within normal limits Gait & Station: normal Patient leans: N/A   Mental Status Per Nursing Assessment::   On Admission:  Suicidal ideation indicated by others  Current Mental Status by Physician: NA  Loss Factors: Financial problems/change in socioeconomic status  Historical Factors: Impulsivity  Risk Reduction Factors:   Sense of responsibility to family, Religious beliefs about death, Living with another person, especially a relative, Positive social support, Positive therapeutic relationship and Positive coping skills or problem solving skills  Continued Clinical Symptoms:  Depression:   Recent sense of peace/wellbeing Alcohol/Substance  Abuse/Dependencies Previous Psychiatric Diagnoses and Treatments  Cognitive Features That Contribute To Risk:  Polarized thinking    Suicide Risk:  Minimal: No identifiable suicidal ideation.  Patients presenting with no risk factors but with morbid ruminations; may be classified as minimal risk based on the severity of the depressive symptoms  Discharge Diagnoses:   AXIS I:  Substance Abuse and Substance Induced Mood Disorder AXIS II:  Deferred AXIS III:   Past Medical History  Diagnosis Date  . Polysubstance dependence including opioid type drug, episodic abuse etoh and heroin   AXIS IV:  other psychosocial or environmental problems, problems related to social environment and problems with primary support group AXIS V:  61-70 mild symptoms  Plan Of Care/Follow-up recommendations:  Activity:  as tolerated Diet:  Regular  Is patient on multiple antipsychotic therapies at discharge:  No   Has Patient had three or more failed trials of antipsychotic monotherapy by history:  No  Recommended Plan for Multiple Antipsychotic Therapies: NA    Janice Torres,Janice R. 12/31/2013, 10:56 AM

## 2013-12-31 NOTE — Tx Team (Signed)
Interdisciplinary Treatment Plan Update (Adult)  Date: 12/31/2013  Time Reviewed:  9:45 AM  Progress in Treatment: Attending groups: Yes Participating in groups:  Yes Taking medication as prescribed:  Yes Tolerating medication:  Yes Family/Significant othe contact made: Yes, with pt's mother Patient understands diagnosis:  Yes Discussing patient identified problems/goals with staff:  Yes Medical problems stabilized or resolved:  Yes Denies suicidal/homicidal ideation: Yes Issues/concerns per patient self-inventory:  Yes Other:  New problem(s) identified: N/A  Discharge Plan or Barriers: Pt will [facility] [place] for medication management.    Reason for Continuation of Hospitalization: Stable to d/c today  Comments: N/A  Estimated length of stay: D/C today  For review of initial/current patient goals, please see plan of care.  Attendees: Patient:  Janice Torres  12/31/2013 11:57 AM   Family:     Physician:  Dr. Javier GlazierJohnalagadda 12/31/2013 11:57 AM   Nursing:   Marzetta Boardhrista Dopson, RN 12/31/2013 11:57 AM   Clinical Social Worker:  Reyes Ivanhelsea Horton, LCSW 12/31/2013 11:57 AM   Other: Claudette Headonrad Withrow, PA 12/31/2013 11:57 AM   Other:  Sherrye PayorValerie Noch, care coordination 12/31/2013 11:57 AM   Other:  Juline PatchQuylle Hodnett, LCSW 12/31/2013 11:57 AM   Other:  Quintella ReichertBeverly Knight, RN 12/31/2013 11:57 AM   Other: Neill Loftarol Davis, RN 12/31/2013 11:57 AM   Other:    Other:    Other:    Other:      Scribe for Treatment Team:   Carmina MillerHorton, Martavius Lusty Nicole, 12/31/2013 , 11:57 AM
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
## 2013-12-31 NOTE — Progress Notes (Signed)
[facility] :  Will you be returning to the same living situation after discharge: Yes,  returning home At discharge, do you have transportation home?:Yes,  family picked pt up Do you have the ability to pay for your medications:Yes,  provided pt with prescriptions and pt verbalizes ability to afford meds  Release of information consent forms completed and in the chart;  Patient's signature needed at discharge.  Patient to Follow up at: Follow-up Information   [facility]08/2014. (Appointment scheduled at 2:00 pm with Dr. Hilton Janice Torres for medication management.)    Contact information:   [address] [place], [place] [address] Phone: [phone]      Patient denies SI/HI:   Yes,  denies SI/HI    Safety Planning and Suicide Prevention discussed:  Yes,  discussed with pt and pt's mother.  See suicide prevention education note.   Janice MillerHorton, Janice Torres 12/31/2013, 12:02 PM

## 2013-12-31 NOTE — [facility] Group Notes (Signed)
[facility]   12/31/2013 8:45 AM  Participation Quality:  Alert, Appropriate and Oriented  Mood/Affect:  Calm  Depression Rating:  0  Anxiety Rating:  0  Thoughts of Suicide:  Pt denies SI/HI  Will you contract for safety?   Yes  Current AVH:  Pt denies  Plan for Discharge/Comments:  Pt attended discharge planning group and actively participated in group.  CSW provided pt with today's workbook.  Pt reports feeling stable to d/c today and is eager to d/c due to her 72 hour request for d/c being up this morning.  [place] and follow up at the methadone clinic there and [facility] [place].  No further needs voiced by pt at this time.    Transportation Means: Pt reports access to transportation - family will pick pt up  Supports: No supports mentioned at this time  Reyes IvanChelsea Horton, LCSW 12/31/2013 10:01 AM

## 2014-01-03 NOTE — Progress Notes (Signed)
Patient Discharge Instructions:  Next Level Care Provider Has Access to the EMR, 01/03/14 Records provided to [facility] via [facility]/Epic access.  Jerelene ReddenSheena E Hedrick, 01/03/2014, 2:54 PM

## 2014-01-31 ENCOUNTER — Encounter (HOSPITAL_COMMUNITY): Payer: Self-pay

## 2014-02-08 ENCOUNTER — Ambulatory Visit (HOSPITAL_COMMUNITY): Payer: Self-pay | Admitting: Psychiatry
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
